# Patient Record
Sex: Female | Born: 1995 | Race: Black or African American | Hispanic: No | Marital: Single | Smoking: Never smoker
Health system: Southern US, Community
[De-identification: ages and names within clinical notes are randomized; demographics above are authoritative.]

---

## 2015-05-28 ENCOUNTER — Emergency Department (INDEPENDENT_AMBULATORY_CARE_PROVIDER_SITE_OTHER): Payer: No Typology Code available for payment source

## 2015-05-28 ENCOUNTER — Emergency Department (INDEPENDENT_AMBULATORY_CARE_PROVIDER_SITE_OTHER)
Admission: EM | Admit: 2015-05-28 | Discharge: 2015-05-28 | Disposition: A | Discharge status: Home / Self Care | Payer: No Typology Code available for payment source | Source: Home / Self Care

## 2015-05-28 ENCOUNTER — Encounter (HOSPITAL_COMMUNITY): Payer: Self-pay | Admitting: Emergency Medicine

## 2015-05-28 DIAGNOSIS — S6710XA Crushing injury of unspecified finger(s), initial encounter: Secondary | ICD-10-CM

## 2015-05-28 NOTE — Discharge Instructions (Signed)
Crush Injury, Fingers or Toes  A crush injury means the fingers or toes are hurt by being squeezed (compressed).  HOME CARE  · Raise (elevate) the injured part above the level of your heart. Do this as much as you can for the first few days.  · Put ice on the injured area.    Put ice in a plastic bag.    Place a towel between your skin and the bag.    Leave the ice on for 15-20 minutes, 03-04 times a day for the first 2 days.  · Only take medicine as told by your doctor.  · Use the injured part only as told by your doctor.  · Change bandages (dressings) as told by your doctor.  · Keep all doctor visits as told.  GET HELP RIGHT AWAY IF:   · There is redness, puffiness (swelling), or more pain in the injured finger or toe.  · Yellowish-white fluid (pus) comes from the wound.  · You have a fever.  · A bad smell comes from the wound or bandage.  · The wound breaks open.  · You cannot move the injured finger or toe.  MAKE SURE YOU:   · Understand these instructions.  · Will watch your condition.  · Will get help right away if you are not doing well or get worse.     This information is not intended to replace advice given to you by your health care provider. Make sure you discuss any questions you have with your health care provider.     Document Released: 12/12/2009 Document Revised: 09/16/2011 Document Reviewed: 11/09/2010  [employer]vier Interactive Patient Education ©2016 [employer]

## 2015-05-28 NOTE — ED Notes (Signed)
Pt reports she slammed left index finger onset 1400 today Has a small laceration on fingertip; bleeding Last tetanus = w/in 5 years A&O x4... No acute distress.

## 2015-05-28 NOTE — ED Provider Notes (Signed)
CSN: [id number]     Arrival date & time 05/28/15  1532 History   None    Chief Complaint  Patient presents with  . Finger Injury   (Consider location/radiation/quality/duration/timing/severity/associated sxs/prior Treatment) HPI History obtained from patient:   LOCATION: left index finger SEVERITY: 4 DURATION: 2 hours CONTEXT: Closed in metal door QUALITY: Aching MODIFYING FACTORS: No compresses ASSOCIATED SYMPTOMS: Bleeding TIMING: Constant OCCUPATION: Student  History reviewed. No pertinent past medical history. History reviewed. No pertinent past surgical history. No family history on file. Social History  Substance Use Topics  . Smoking status: Never Smoker   . Smokeless tobacco: None  . Alcohol Use: No   OB History    No data available     Review of Systems ROS +'ve Left index finger injury Denies: HEADACHE, NAUSEA, ABDOMINAL PAIN, CHEST PAIN, CONGESTION, DYSURIA, SHORTNESS OF BREATH  Allergies  Review of patient's allergies indicates no known allergies.  Home Medications   Prior to Admission medications   Not on File   Meds Ordered and Administered this Visit  Medications - No data to display  BP 103/66 mmHg  Pulse 63  Temp(Src) 98.2 F (36.8 C) (Oral)  Resp 18  SpO2 99%  LMP 05/25/2015 No data found.   Physical Exam  Constitutional: She appears well-developed and well-nourished.  Pulmonary/Chest: Effort normal.  Musculoskeletal: Normal range of motion. She exhibits tenderness.       Hands: Nursing note and vitals reviewed.   ED Course  Procedures (including critical care time)  Labs Review Labs Reviewed - No data to display  Imaging Review Dg Finger Index Left  05/28/2015  CLINICAL DATA:  Left second finger injury EXAM: LEFT INDEX FINGER 2+V COMPARISON:  None. FINDINGS: There is no evidence of fracture or dislocation. There is no evidence of arthropathy or other focal bone abnormality. Soft tissues are unremarkable. IMPRESSION:  Negative. Electronically Signed   By: Delbert PhenixJason A Poff M.D.   On: 05/28/2015 16:24     Visual Acuity Review  Right Eye Distance:   Left Eye Distance:   Bilateral Distance:    Right Eye Near:   Left Eye Near:    Bilateral Near:         MDM   1. Crush injury to finger, initial encounter    Independent review of the left index finger x-ray does not reveal fracture. Steri-Strips were applied to the superficial laceration. Dressing was applied over this. Instructions of care provided patient is discharged home in stable condition.    Tharon AquasFrank C Patrick, PA 05/28/15 [phone]

## 2016-11-25 IMAGING — DX DG FINGER INDEX 2+V*L*
3 series · 3 of 3 positions shown · non-contrast
Comparison: None.

CLINICAL DATA: Left second finger injury

EXAM:
LEFT INDEX FINGER 2+V

[finger ap]
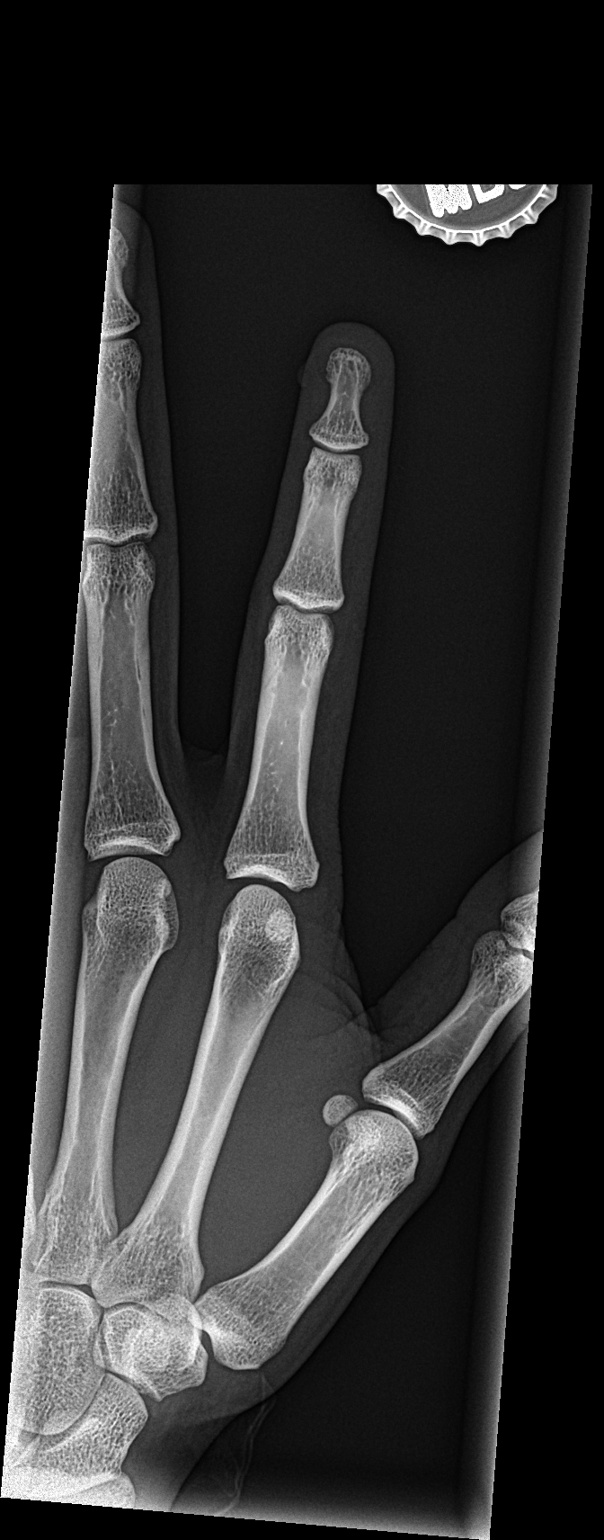

[finger obl]
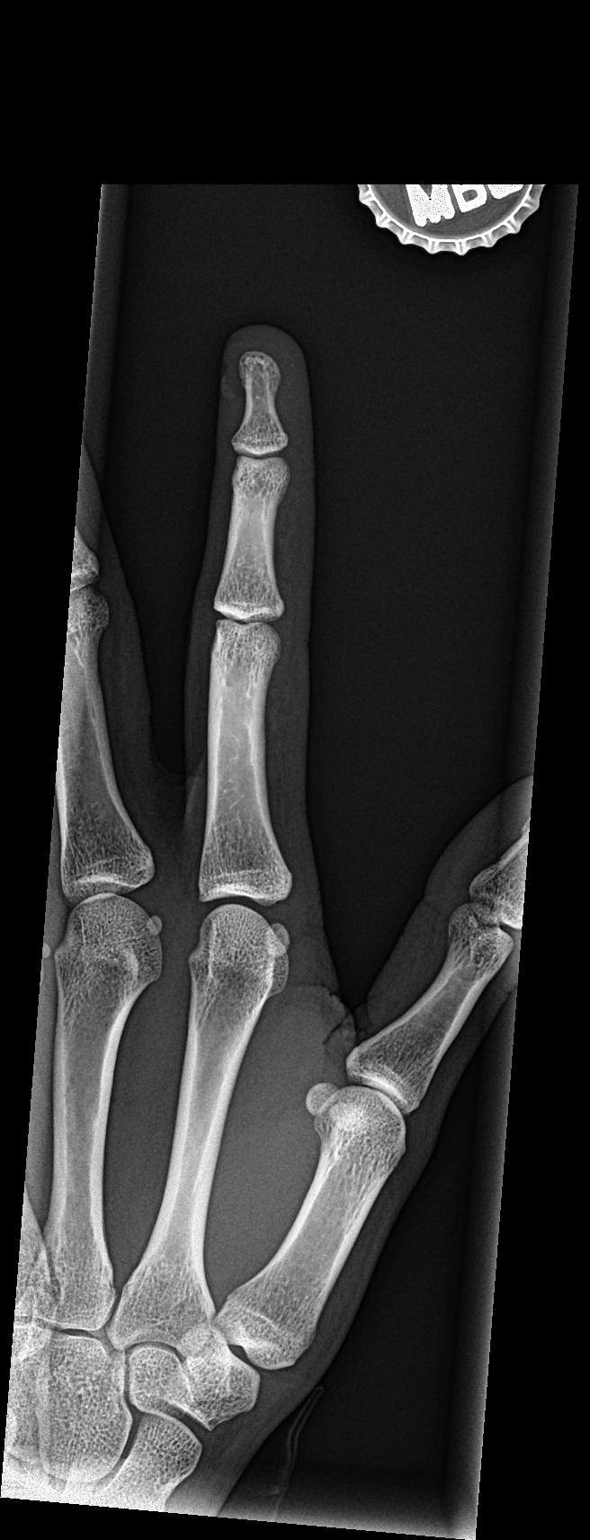

[finger lat]
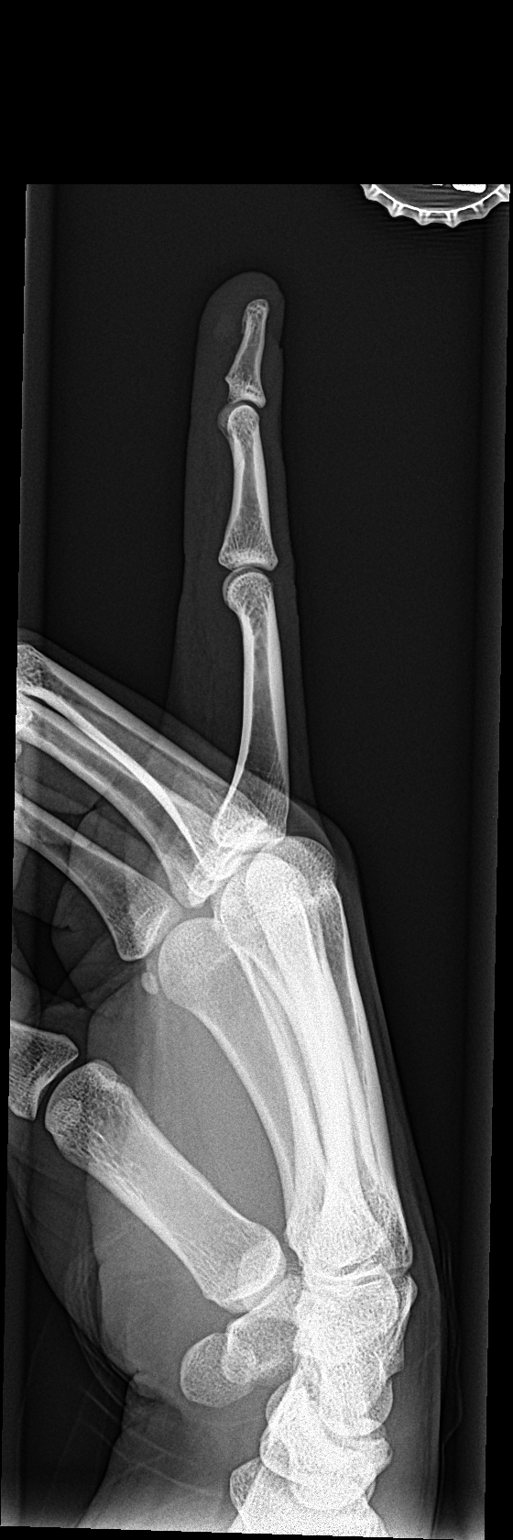

[3 of 3 positions shown; findings below may reference images not displayed]

FINDINGS: There is no evidence of fracture or dislocation. There is no
evidence of arthropathy or other focal bone abnormality. Soft
tissues are unremarkable.
IMPRESSION: Negative.
